# Patient Record
Sex: Male | Born: 1995 | Race: White | Hispanic: No | Marital: Single | Smoking: Never smoker
Health system: Southern US, Community
[De-identification: ages and names within clinical notes are randomized; demographics above are authoritative.]

## PROBLEM LIST (undated history)

## (undated) DIAGNOSIS — J45909 Unspecified asthma, uncomplicated: Secondary | ICD-10-CM

## (undated) HISTORY — PX: TONSILLECTOMY: SUR1361

## (undated) HISTORY — PX: NASAL RECONSTRUCTION WITH SEPTAL REPAIR: SHX5665

## (undated) HISTORY — PX: APPENDECTOMY: SHX54

---

## 2016-12-28 ENCOUNTER — Emergency Department
Admission: EM | Admit: 2016-12-28 | Discharge: 2016-12-28 | Disposition: A | Discharge status: Home / Self Care | Payer: BLUE CROSS/BLUE SHIELD | Attending: Emergency Medicine | Admitting: Emergency Medicine

## 2016-12-28 ENCOUNTER — Emergency Department: Payer: BLUE CROSS/BLUE SHIELD

## 2016-12-28 DIAGNOSIS — Y93B3 Activity, free weights: Secondary | ICD-10-CM | POA: Insufficient documentation

## 2016-12-28 DIAGNOSIS — Y929 Unspecified place or not applicable: Secondary | ICD-10-CM | POA: Diagnosis not present

## 2016-12-28 DIAGNOSIS — S39012A Strain of muscle, fascia and tendon of lower back, initial encounter: Secondary | ICD-10-CM | POA: Insufficient documentation

## 2016-12-28 DIAGNOSIS — Y999 Unspecified external cause status: Secondary | ICD-10-CM | POA: Diagnosis not present

## 2016-12-28 DIAGNOSIS — M62830 Muscle spasm of back: Secondary | ICD-10-CM | POA: Diagnosis not present

## 2016-12-28 DIAGNOSIS — S3992XA Unspecified injury of lower back, initial encounter: Secondary | ICD-10-CM | POA: Diagnosis present

## 2016-12-28 DIAGNOSIS — J45909 Unspecified asthma, uncomplicated: Secondary | ICD-10-CM | POA: Insufficient documentation

## 2016-12-28 DIAGNOSIS — X500XXA Overexertion from strenuous movement or load, initial encounter: Secondary | ICD-10-CM | POA: Diagnosis not present

## 2016-12-28 HISTORY — DX: Unspecified asthma, uncomplicated: J45.909

## 2016-12-28 MED ORDER — NAPROXEN 500 MG PO TABS
500.0000 mg | ORAL_TABLET | Freq: Once | ORAL | Status: AC
  Administered 2016-12-28: 500 mg via ORAL
  Filled 2016-12-28: qty 1

## 2016-12-28 MED ORDER — CYCLOBENZAPRINE HCL 10 MG PO TABS
10.0000 mg | ORAL_TABLET | Freq: Once | ORAL | Status: AC
  Administered 2016-12-28: 10 mg via ORAL
  Filled 2016-12-28: qty 1

## 2016-12-28 MED ORDER — CYCLOBENZAPRINE HCL 5 MG PO TABS
5.0000 mg | ORAL_TABLET | Freq: Three times a day (TID) | ORAL | 0 refills | Status: AC | PRN
Start: 2016-12-28 — End: ?

## 2016-12-28 MED ORDER — NAPROXEN 500 MG PO TABS: 500.0000 mg | ORAL_TABLET | Freq: Two times a day (BID) | ORAL | 0 refills | Status: AC

## 2016-12-28 NOTE — ED Provider Notes (Signed)
[facility] Emergency Department Provider Note ____________________________________________  Time seen: 1614  I have reviewed the triage vital signs and the nursing notes.  HISTORY  Chief Complaint  Back Pain  HPI Nicholas Estrada is a 21 y.o. male presents to the ED for evaluation of low back pain with onset today. Patient describes he was lifting weights yesterday, at the gym. He also admits that he was walking [place] with all of his newly purchased textbooks., He rates the total weight of the [place] was probably around 60 pounds. He denies any distal paresthesias, foot drop, or incontinence. As he denies any history of chronic ongoing back pain. He has dosed ibuprofen several times with limited benefit. He is concerned for a herniated disc, after he spoke to several friends and family members who have had back pain in the past.   Past Medical History:  Diagnosis Date  . Asthma     There are no active problems to display for this patient.   Past Surgical History:  Procedure Laterality Date  . APPENDECTOMY    . NASAL RECONSTRUCTION WITH SEPTAL REPAIR    . TONSILLECTOMY      Prior to Admission medications   Medication Sig Start Date End Date Taking? Authorizing Provider  cyclobenzaprine (FLEXERIL) 5 MG tablet Take 1 tablet (5 mg total) by mouth 3 (three) times daily as needed for muscle spasms. 12/28/16   Grady Mohabir, Charlesetta IvoryJenise V Bacon, PA-C  naproxen (NAPROSYN) 500 MG tablet Take 1 tablet (500 mg total) by mouth 2 (two) times daily with a meal. 12/28/16 01/27/17  Iveth Heidemann, Charlesetta IvoryJenise V Bacon, PA-C    Allergies Patient has no known allergies.  No family history on file.  Social History Social History  Substance Use Topics  . Smoking status: Never Smoker  . Smokeless tobacco: Never Used  . Alcohol use Yes     Comment: occassionally    Review of Systems  Constitutional: Negative for fever. Cardiovascular: Negative for chest pain. Respiratory:  Negative for shortness of breath. Gastrointestinal: Negative for abdominal pain, vomiting and diarrhea. Genitourinary: Negative for dysuria. Musculoskeletal: positive for back pain. Skin: Negative for rash. Neurological: Negative for headaches, focal weakness or numbness. ____________________________________________  PHYSICAL EXAM:  VITAL SIGNS: ED Triage Vitals  Enc Vitals Group     BP 12/28/16 1605 (!) 124/105     Pulse Rate 12/28/16 1604 60     Resp 12/28/16 1604 16     Temp 12/28/16 1604 98.2 F (36.8 C)     Temp Source 12/28/16 1604 Oral     SpO2 12/28/16 1604 99 %     Weight 12/28/16 1605 190 lb (86.2 kg)     Height 12/28/16 1605 6\' 5"  (1.956 m)     Head Circumference --      Peak Flow --      Pain Score 12/28/16 1605 8     Pain Loc --      Pain Edu? --      Excl. in GC? --     Constitutional: Alert and oriented. Well appearing and in no distress. Head: Normocephalic and atraumatic. Cardiovascular: Normal rate, regular rhythm. Normal distal pulses. Respiratory: Normal respiratory effort. No wheezes/rales/rhonchi. Gastrointestinal: Soft and nontender. No distention. Musculoskeletal: normal spinal alignment without midline tenderness, spasm, deformity, or step-off. Negative supne straight leg raise. Patient does have some bilateral paraspinal muscle rigidity. He has normal lumbar flexion and decreased extension range, due to pain. Normal toe and heel raise on exam. Nontender with  normal range of motion in all extremities.  Neurologic:  Normal gait without ataxia. Normal LE DTRs bilaterally.  Normal speech and language. No gross focal neurologic deficits are appreciated. Skin:  Skin is warm, dry and intact. No rash noted. ___________________________________________   RADIOLOGY  Lumbar Spine  IMPRESSION: 1. No acute fracture traumatic subluxation. 2. Straightening of the normal lumbar lordosis. This is nonspecific, but commonly seen in the setting of muscle strain or  ongoing pain.  I, Joffre Lucks, Charlesetta Ivory, personally viewed and evaluated these images (plain radiographs) as part of my medical decision making, as well as reviewing the written report by the radiologist. ____________________________________________  PROCEDURES  Flexeril 10 mg IM Naproxen 500 mg PO ____________________________________________  INITIAL IMPRESSION / ASSESSMENT AND PLAN / ED COURSE  Patient was ED evaluation of acute lumbar strain following weightlifting activities. Patient's exam is overall benign at this time without any acute neuromuscular deficit. His exam is negative for any acute fracture or dislocation. Patient is reassured by his findings and will be discharged with prescriptions for Flexeril and naproxen to dose as directed. He is advised to rest, and ice the low back for comfort. He is also given instructions on some low back exercises. He is advised to follow-up with orthopedics for ongoing or continuing problems. ____________________________________________  FINAL CLINICAL IMPRESSION(S) / ED DIAGNOSES  Final diagnoses:  Strain of lumbar region, initial encounter  Muscle spasm of back      Lissa Hoard, PA-C 12/30/16 0104    Loleta Rose, MD 12/30/16 [phone]

## 2016-12-28 NOTE — Discharge Instructions (Signed)
Your exam and x-ray are essentially normal at this time. There is no evidence of an acute fracture, dislocation, or herniated disc. Take the prescription meds as directed. Apply ice and/or moist heat to reduce symptoms. Follow-up with ortho for continued symptoms.

## 2016-12-28 NOTE — ED Notes (Signed)
PT ambulatory at discharge. Pt in NAD. Pt verbalized understanding of follow up care.

## 2016-12-28 NOTE — ED Notes (Signed)
Pt reports having previous hx of back pain.  Pt states that he has been walking a lot at school, pt is an [employer] student.  Pt also reports that he has been lifting heavy books in his back pack.  Pt states that he was deadlifting weights at the gym yesterday, but states that he didn't feel the pain until laying down after dinner last night.  Pt denies previous injuries to back.

## 2016-12-28 NOTE — ED Triage Notes (Signed)
Pt c/o lower back pain that started today, states he had been lifting wts yesterday.

## 2018-02-17 IMAGING — CR DG LUMBAR SPINE 2-3V
1 series · 3 of 3 positions shown · non-contrast
Comparison: None.

CLINICAL DATA: Low back pain beginning earlier today. Lifting
weights yesterday.

EXAM:
LUMBAR SPINE - 2-3 VIEW

[Series 1: t lumbar spine ap · 0.14mm/px · 3 of 3 slices shown]
[im 1/3]
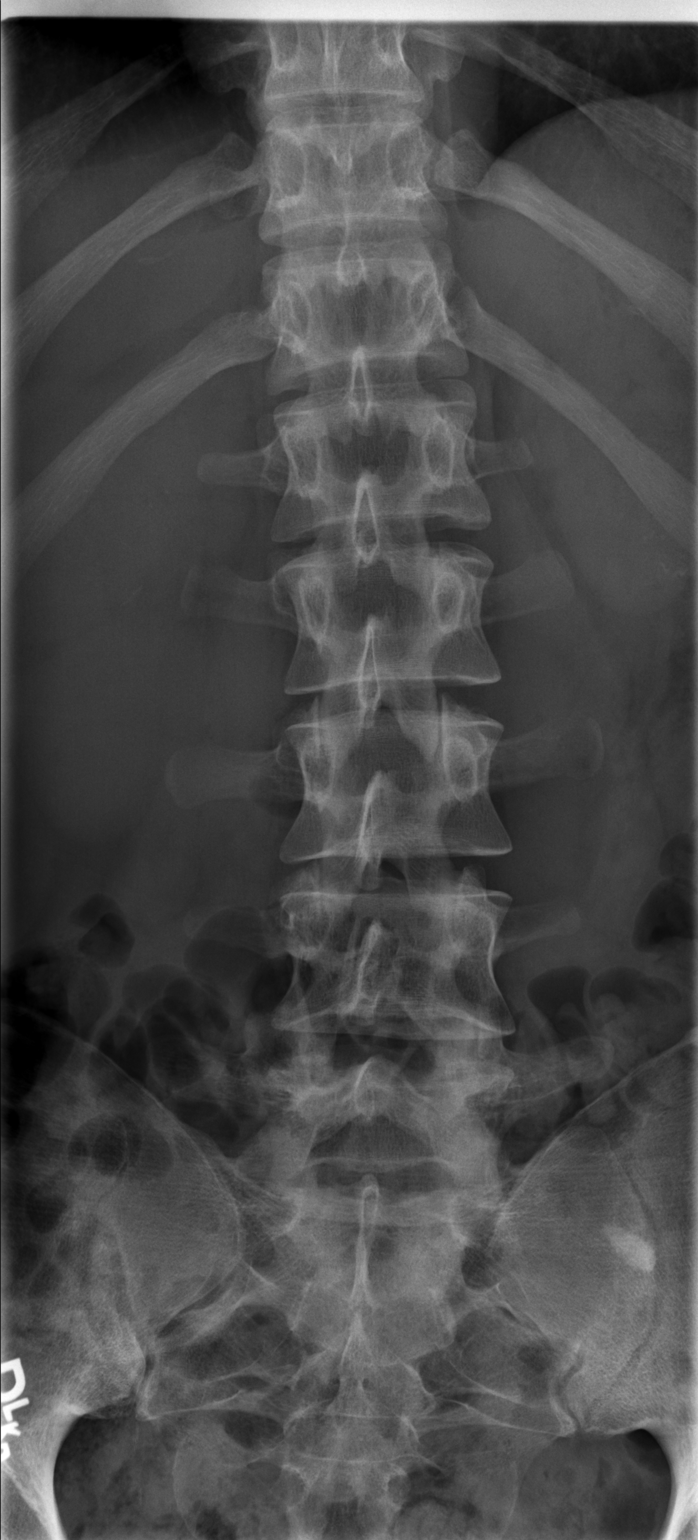
[im 2/3]
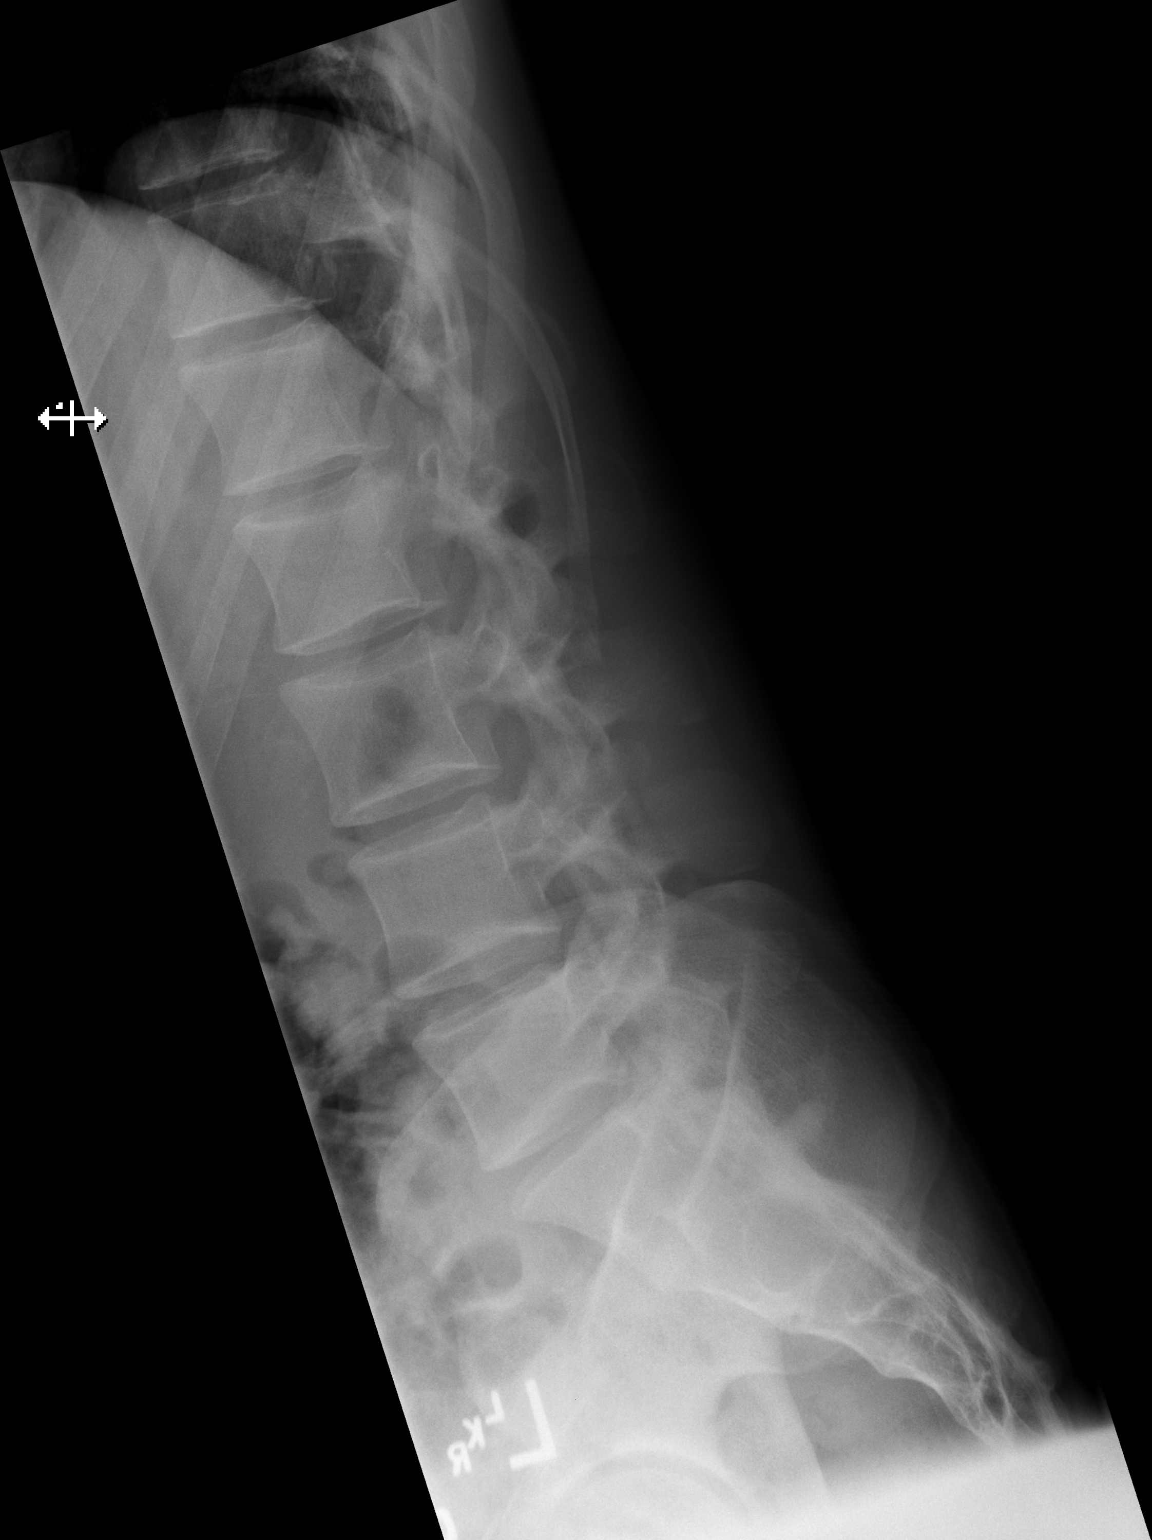
[im 3/3]
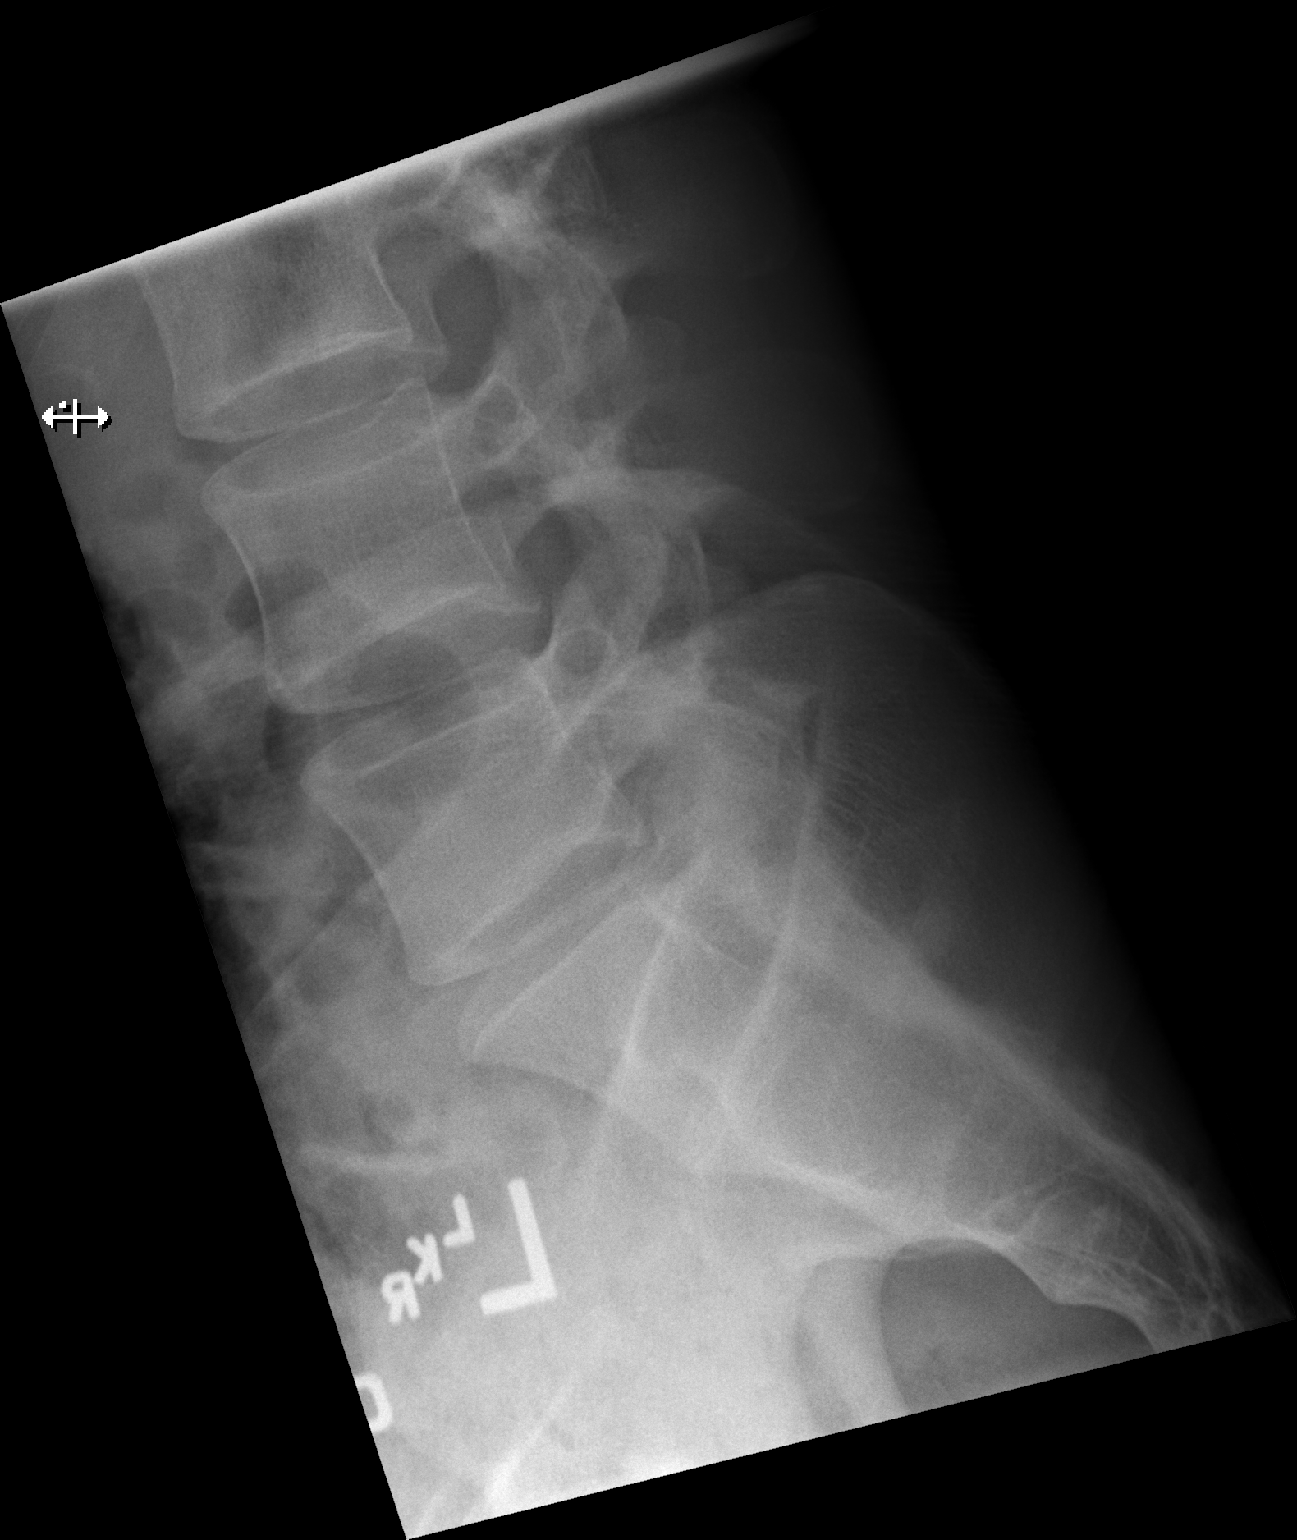

[3 of 3 positions shown; findings below may reference images not displayed]

FINDINGS: Five non rib-bearing lumbar type vertebral bodies are present. The
vertebral body heights are normal. AP alignment is anatomic. There
is slight leftward curvature. There is straightening of the normal
lumbar lordosis. No acute fracture is evident.
IMPRESSION: 1. No acute fracture traumatic subluxation.
2. Straightening of the normal lumbar lordosis. This is nonspecific,
but commonly seen in the setting of muscle strain or ongoing pain.
